# Patient Record
Sex: Female | Born: 1970 | Race: Black or African American | Hispanic: No | Marital: Single | Smoking: Current some day smoker
Health system: Southern US, Community
[De-identification: ages and names within clinical notes are randomized; demographics above are authoritative.]

## PROBLEM LIST (undated history)

## (undated) HISTORY — PX: THYROID SURGERY: SHX805

---

## 2010-02-26 ENCOUNTER — Emergency Department (HOSPITAL_COMMUNITY): Admission: EM | Admit: 2010-02-26 | Discharge: 2010-02-27 | Payer: Self-pay | Admitting: Emergency Medicine

## 2010-08-06 ENCOUNTER — Inpatient Hospital Stay (HOSPITAL_COMMUNITY): Admission: AD | Admit: 2010-08-06 | Discharge: 2010-08-06 | Payer: Self-pay | Admitting: Obstetrics & Gynecology

## 2010-08-06 ENCOUNTER — Ambulatory Visit: Payer: Self-pay | Admitting: Advanced Practice Midwife

## 2010-08-29 ENCOUNTER — Ambulatory Visit: Payer: Self-pay | Admitting: Obstetrics & Gynecology

## 2010-11-19 ENCOUNTER — Inpatient Hospital Stay (HOSPITAL_COMMUNITY): Payer: Self-pay

## 2010-11-19 ENCOUNTER — Inpatient Hospital Stay (HOSPITAL_COMMUNITY)
Admission: EM | Admit: 2010-11-19 | Discharge: 2010-11-21 | DRG: 069 | Disposition: A | Discharge status: Home / Self Care | Payer: Self-pay | Attending: Internal Medicine | Admitting: Internal Medicine

## 2010-11-19 ENCOUNTER — Emergency Department (HOSPITAL_COMMUNITY): Payer: Self-pay

## 2010-11-19 DIAGNOSIS — Z7982 Long term (current) use of aspirin: Secondary | ICD-10-CM

## 2010-11-19 DIAGNOSIS — Z23 Encounter for immunization: Secondary | ICD-10-CM

## 2010-11-19 DIAGNOSIS — F17200 Nicotine dependence, unspecified, uncomplicated: Secondary | ICD-10-CM | POA: Diagnosis present

## 2010-11-19 DIAGNOSIS — K219 Gastro-esophageal reflux disease without esophagitis: Secondary | ICD-10-CM | POA: Diagnosis present

## 2010-11-19 DIAGNOSIS — G459 Transient cerebral ischemic attack, unspecified: Principal | ICD-10-CM | POA: Diagnosis present

## 2010-11-19 DIAGNOSIS — Z91013 Allergy to seafood: Secondary | ICD-10-CM

## 2010-11-19 DIAGNOSIS — D259 Leiomyoma of uterus, unspecified: Secondary | ICD-10-CM | POA: Diagnosis present

## 2010-11-19 DIAGNOSIS — D509 Iron deficiency anemia, unspecified: Secondary | ICD-10-CM | POA: Diagnosis present

## 2010-11-19 DIAGNOSIS — E876 Hypokalemia: Secondary | ICD-10-CM | POA: Diagnosis present

## 2010-11-19 DIAGNOSIS — R0789 Other chest pain: Secondary | ICD-10-CM | POA: Diagnosis present

## 2010-11-19 LAB — BASIC METABOLIC PANEL
BUN: 8 mg/dL (ref 6–23)
CO2: 27 mEq/L (ref 19–32)
GFR calc Af Amer: >60 mL/min (ref >60)
Glucose, Bld: 114 mg/dL — ABNORMAL HIGH (ref 70–99)

## 2010-11-19 LAB — URINALYSIS, ROUTINE W REFLEX MICROSCOPIC
Hgb urine dipstick: NEGATIVE
Nitrite: NEGATIVE
Urine Glucose, Fasting: NEGATIVE mg/dL

## 2010-11-19 LAB — CBC
HCT: 25.3 % — ABNORMAL LOW (ref 36.0–46.0)
Hemoglobin: 7.4 g/dL — ABNORMAL LOW (ref 12.0–15.0)
MCV: 68.4 fL — ABNORMAL LOW (ref 78.0–100.0)
RBC: 3.7 MIL/uL — ABNORMAL LOW (ref 3.87–5.11)
RDW: 17.5 % — ABNORMAL HIGH (ref 11.5–15.5)
WBC: 6.4 10*3/uL (ref 4.0–10.5)

## 2010-11-19 LAB — LIPID PANEL
Cholesterol: 129 mg/dL (ref 0–200)
HDL: 47 mg/dL (ref >39)
LDL Cholesterol: 59 mg/dL (ref 0–99)
Total CHOL/HDL Ratio: 2.7 RATIO
Triglycerides: 115 mg/dL (ref <150)
VLDL: 23 mg/dL (ref 0–40)

## 2010-11-19 LAB — RAPID URINE DRUG SCREEN, HOSP PERFORMED
Amphetamines: NOT DETECTED
Benzodiazepines: NOT DETECTED
Cocaine: NOT DETECTED
Opiates: NOT DETECTED

## 2010-11-19 LAB — CK TOTAL AND CKMB (NOT AT ARMC)
CK, MB: 0.8 ng/mL (ref 0.3–4.0)
Relative Index: INVALID (ref 0.0–2.5)
Total CK: 77 U/L (ref 7–177)

## 2010-11-19 LAB — TSH: TSH: 0.668 u[IU]/mL (ref 0.350–4.500)

## 2010-11-19 LAB — POCT CARDIAC MARKERS: CKMB, poc: <1 ng/mL — ABNORMAL LOW (ref 1.0–8.0)

## 2010-11-19 LAB — CARDIAC PANEL(CRET KIN+CKTOT+MB+TROPI)
CK, MB: 0.7 ng/mL (ref 0.3–4.0)
Relative Index: INVALID (ref 0.0–2.5)
Troponin I: <0.01 ng/mL (ref 0.00–0.06)

## 2010-11-19 LAB — VITAMIN B12: Vitamin B-12: 650 pg/mL (ref 211–911)

## 2010-11-19 LAB — PREPARE RBC (CROSSMATCH)

## 2010-11-19 LAB — DIFFERENTIAL
Lymphs Abs: 2.8 10*3/uL (ref 0.7–4.0)
Monocytes Relative: 7 % (ref 3–12)
Neutrophils Relative %: 43 % (ref 43–77)

## 2010-11-19 LAB — IRON AND TIBC: Saturation Ratios: 3 % — ABNORMAL LOW (ref 20–55)

## 2010-11-19 LAB — APTT: aPTT: 25 seconds (ref 24–37)

## 2010-11-19 LAB — PROTIME-INR: INR: 0.9 (ref 0.00–1.49)

## 2010-11-20 ENCOUNTER — Inpatient Hospital Stay (HOSPITAL_COMMUNITY): Payer: Self-pay

## 2010-11-20 ENCOUNTER — Encounter (HOSPITAL_COMMUNITY): Payer: Self-pay | Admitting: Radiology

## 2010-11-20 LAB — BASIC METABOLIC PANEL
BUN: 7 mg/dL (ref 6–23)
Calcium: 8.8 mg/dL (ref 8.4–10.5)
GFR calc non Af Amer: >60 mL/min (ref >60)
Potassium: 3.9 mEq/L (ref 3.5–5.1)
Sodium: 139 mEq/L (ref 135–145)

## 2010-11-20 LAB — URINALYSIS, ROUTINE W REFLEX MICROSCOPIC
Ketones, ur: NEGATIVE mg/dL
Nitrite: NEGATIVE
Specific Gravity, Urine: 1.016 (ref 1.005–1.030)
Urobilinogen, UA: 0.2 mg/dL (ref 0.0–1.0)
pH: 7 (ref 5.0–8.0)

## 2010-11-20 LAB — CARDIAC PANEL(CRET KIN+CKTOT+MB+TROPI): Total CK: 34 U/L (ref 7–177)

## 2010-11-20 LAB — CBC
MCHC: 29.4 g/dL — ABNORMAL LOW (ref 30.0–36.0)
Platelets: 286 10*3/uL (ref 150–400)
RDW: 17.4 % — ABNORMAL HIGH (ref 11.5–15.5)
WBC: 6 10*3/uL (ref 4.0–10.5)

## 2010-11-20 MED ORDER — IOHEXOL 300 MG/ML  SOLN
80.0000 mL | Freq: Once | INTRAMUSCULAR | Status: AC | PRN
  Administered 2010-11-20: 80 mL via INTRAVENOUS

## 2010-11-20 MED ORDER — IOHEXOL 300 MG/ML  SOLN: 100.0000 mL | Freq: Once | INTRAMUSCULAR | Status: DC | PRN

## 2010-11-21 DIAGNOSIS — G459 Transient cerebral ischemic attack, unspecified: Secondary | ICD-10-CM

## 2010-11-21 LAB — CBC
Hemoglobin: 10 g/dL — ABNORMAL LOW (ref 12.0–15.0)
MCH: 21.6 pg — ABNORMAL LOW (ref 26.0–34.0)
MCHC: 30 g/dL (ref 30.0–36.0)
MCV: 71.9 fL — ABNORMAL LOW (ref 78.0–100.0)
RBC: 4.63 MIL/uL (ref 3.87–5.11)

## 2010-11-21 LAB — URINE CULTURE
Colony Count: NO GROWTH
Culture: NO GROWTH

## 2010-11-22 LAB — TYPE AND SCREEN
ABO/RH(D): AB POS
Antibody Screen: NEGATIVE
Unit division: 0
Unit division: 0

## 2010-11-26 ENCOUNTER — Inpatient Hospital Stay (HOSPITAL_COMMUNITY): Payer: Self-pay

## 2010-11-26 ENCOUNTER — Inpatient Hospital Stay (HOSPITAL_COMMUNITY)
Admission: AD | Admit: 2010-11-26 | Discharge: 2010-11-26 | Disposition: A | Discharge status: Home / Self Care | Payer: Self-pay | Source: Ambulatory Visit | Attending: Obstetrics & Gynecology | Admitting: Obstetrics & Gynecology

## 2010-11-26 DIAGNOSIS — D259 Leiomyoma of uterus, unspecified: Secondary | ICD-10-CM | POA: Insufficient documentation

## 2010-11-26 DIAGNOSIS — R109 Unspecified abdominal pain: Secondary | ICD-10-CM

## 2010-11-26 LAB — URINALYSIS, ROUTINE W REFLEX MICROSCOPIC
Nitrite: NEGATIVE
Specific Gravity, Urine: 1.01 (ref 1.005–1.030)
Urobilinogen, UA: 0.2 mg/dL (ref 0.0–1.0)
pH: 7 (ref 5.0–8.0)

## 2010-11-26 LAB — CBC
Hemoglobin: 11.1 g/dL — ABNORMAL LOW (ref 12.0–15.0)
MCH: 22.5 pg — ABNORMAL LOW (ref 26.0–34.0)
Platelets: 261 10*3/uL (ref 150–400)
RBC: 4.94 MIL/uL (ref 3.87–5.11)
WBC: 6.5 10*3/uL (ref 4.0–10.5)

## 2010-11-26 LAB — WET PREP, GENITAL
Clue Cells Wet Prep HPF POC: NONE SEEN
Trich, Wet Prep: NONE SEEN

## 2010-11-26 LAB — POCT PREGNANCY, URINE: Preg Test, Ur: NEGATIVE

## 2010-11-29 NOTE — H&P (Signed)
[name], [name]               ACCOUNT NO.:  [id number]  MEDICAL RECORD NO.:  [id number]           PATIENT TYPE:  E  LOCATION:  [facility]                         FACILITY:  [facility]  PHYSICIAN:  Lonia Blood, M.D.      DATE OF BIRTH:  02-Jun-1971  DATE OF ADMISSION:  11/19/2010 DATE OF DISCHARGE:                             HISTORY & PHYSICAL   PRIMARY CARE PHYSICIAN:  She is unassigned to [place].  PRESENTING COMPLAINT:  Numbness and tingling in the left leg and possibly passing out.  HISTORY OF PRESENT ILLNESS:  The patient is a 40 year old female with history of anemia and chronic tobacco abuse, who apparently has been doing well until last evening when she suddenly had initially generalized tingling, abnormal sensation, and then left-sided tingling and numbness.  The patient was unable to move even though she was conscious.  She did report initially that she passed out, but later said that she was not really blacked out, she knew what was going on, but could not move her body at all.  This lasted few seconds.  It again came back after a couple of hours.  She feels generalized weakness of the moment, but the symptoms are literally gone.  She denied any diarrhea. No recent nausea or vomiting.  No fever.  No chills.  She has noted some mild exertional dyspnea in the past, but no PND.  No orthopnea.  No prior cardiac history and no history of seizure disorder.  The patient has slight chest pain during the episodes.  PAST MEDICAL HISTORY:  Significant for goiter and anemia.  PAST SURGICAL HISTORY:  Status post removal of goiter from her thyroid gland.  ALLERGIES:  SHELLFISH.  MEDICATIONS:  Currently none.  SOCIAL HISTORY:  The patient lives in [place].  She denied any alcohol or IV drug use.  She smokes about 2 cigarettes a day.  FAMILY HISTORY:  Both parents have diabetes and hypertension.  Her mother had stroke as well as MI in her 72s.  Her stroke was at the age of  40.  REVIEW OF SYSTEMS:  All systems reviewed are otherwise negative except per HPI.  PHYSICAL EXAMINATION:  VITAL SIGNS:  Temperature is 98.6, blood pressure 130/107 initially, currently 111/78 with a pulse of 91, respiratory rate 21, sats 99% on room air. GENERAL:  She is awake, alert, and oriented.  Seems worried, but she is in no acute distress.  HEENT:  PERRL.  EOMI.  No pallor.  No jaundice. No rhinorrhea. NECK:  Supple.  No JVD.  No lymphadenopathy. RESPIRATORY:  She has good air entry bilaterally.  No wheezes.  No rales.  No crackles.  CARDIOVASCULAR SYSTEM:  She has S1 and S2.  No audible murmur. ABDOMEN:  Soft, full, nontender with positive bowel sounds. EXTREMITIES:  No edema, cyanosis, or clubbing. SKIN:  No rashes or ulcers.  LABORATORY DATA:  Her labs showed urinalysis negative.  White count 6.4, hemoglobin 7.4 with an MCV of 68.4, platelet count 287, normal differentials.  Sodium is 138, potassium 3.3, chloride 104, CO2 of 27, glucose 114, BUN 8, creatinine 0.77, and calcium 8.8.  Chest x-ray showed no acute disease.  Head CT without contrast also no significant findings.  Her EKG showed normal sinus rhythm with a rate of 78.  Normal intervals with nonspecific T-wave changes.  ASSESSMENT:  This is a 40 year old female presenting with what appears to be transient ischemic attack versus syncopal episode versus convulsion disorder.  The patient has risk factors for cerebrovascular accident including tobacco abuse, family history, and probably elevated blood pressure as indicated by her initial blood pressure.  She also does not know her cholesterol status.  She also has some family history of early coronary artery disease and this could reflect some cardiac causes, but could also be some elements of anxiety including panic attacks and convulsion disorder, although she had no prior history of psychiatric illness.  Plan therefore; 1. Left-sided numbness, tingling.  At  this point, we will admit the     patient and workup for possible TIA.  It could have been a seizure     disorder too during that period.  We will check carotid Dopplers, 2-     D echo, MRI, MRA of the brain as well as possibly an EEG.  We will     also consider anemia as a cause of syncopal episode, although there     is no active bleed.  I will work her anemia up including checking     stool guaiac, following her hemoglobin closely. 2. Anemia, seems chronic.  The patient reported occasional heavy     periods and she has been told in the past that she may have uterine     fibroid, so this may explain the source of her bleeds.  She     finished her menstrual period about a week ago.  We will check     anemia panel, type and cross match for 2 units of packed red blood     and consider transfusing especially if hemoglobin drops to 7 or     below 7. 3. Tobacco abuse.  Given her some nicotine patch and tobacco cessation     counseling.  Otherwise, further treatment will depend on initial     findings and the patient's response to initial measures.     Lonia Blood, M.D.     Verlin Grills  D:  11/19/2010  T:  11/19/2010  Job:  [id number]  Electronically Signed by Lonia Blood M.D. on 11/29/2010 06:32:38 AM

## 2010-12-04 LAB — CBC
MCV: 70.5 fL — ABNORMAL LOW (ref 78.0–100.0)
Platelets: 208 10*3/uL (ref 150–400)
RBC: 3.93 MIL/uL (ref 3.87–5.11)
RDW: 18.6 % — ABNORMAL HIGH (ref 11.5–15.5)
WBC: 6.5 10*3/uL (ref 4.0–10.5)

## 2010-12-04 LAB — URINALYSIS, ROUTINE W REFLEX MICROSCOPIC
Bilirubin Urine: NEGATIVE
Glucose, UA: NEGATIVE mg/dL
Protein, ur: NEGATIVE mg/dL
Urobilinogen, UA: 0.2 mg/dL (ref 0.0–1.0)

## 2010-12-04 LAB — URINE MICROSCOPIC-ADD ON

## 2010-12-05 NOTE — Discharge Summary (Signed)
[name], [name]               ACCOUNT NO.:  [id number]  MEDICAL RECORD NO.:  [id number]           PATIENT TYPE:  I  LOCATION:  3733                         FACILITY:  [facility]  PHYSICIAN:  Thad Ranger, MD       DATE OF BIRTH:  [birth year]  DATE OF ADMISSION:  11/19/2010 DATE OF DISCHARGE:  11/21/2010                              DISCHARGE SUMMARY   PRIMARY CARE PHYSICIAN:  The patient follows OB at [facility].  DISCHARGE DIAGNOSES: 1. Questionable transient ischemic attack symptoms, resolved. 2. Severe anemia secondary to iron deficiency and uterine fibroids. 3. Atypical chest pain, resolved. 4. Abdominal pain and back pain secondary to uterine fibroids,     improved. 5. Tobacco abuse.  DISCHARGE MEDICATIONS: 1. Ferrous gluconate 325 mg p.o. daily. 2. Percocet 5/325 mg every 6 hours as needed for pain. 3. Protonix 40 mg p.o. daily. 4. Aspirin 81 mg p.o. daily.  BRIEF HISTORY OF PRESENT ILLNESS:  At the time of admission:  [name] is a 40 year old female with history of anemia, chronic tobacco abuse. Apparently was doing well until the evening before the admission when she suddenly had a generalized tingling, abnormal sensation, and left- sided tingling and numbness.  The patient was unable to move even though she was conscious.  She did report initially that she passed out, but then later said she was not really blacked out, but could not move her body at all.  This lasted a few seconds.  For details, please refer to the admission note dictated by Dr. Lonia Blood on November 19, 2010.  RADIOLOGICAL DATA AND PROCEDURES:  Chest x-ray portable on February 27, no acute cardiopulmonary disease.  CT head without contrast, unremarkable.  CT abdomen and pelvis, enlarged and heterogenous uterus, likely due to fibroids; small liver lesion as described, 6 mm in the lateral segment of left lobe of the liver, anteriorly diffuse hepatic steatosis.  MRI of the brain  showed no acute abnormalities, solitary small hyperintensity in the left frontal white matter, not likely clinically insignificant, could cause chronic ischemia or migraine headaches, demyelinating disease is not felt likely.  MRI of the head was normal.  EEG interpretation on February 28, normal.  Two-D echocardiogram on February 29 showed EF of 50% with mild global hypokinesis, grade 1 diastolic dysfunction.  BRIEF HOSPITALIZATION COURSE:  [name] is a 40 year old female who initially presented with left-sided numbness, tingling which was felt possible TIA. 1. Possible TIA versus symptoms from severe anemia.  The patient was     admitted for the TIA workup.  She underwent MRI and MRA of the     head, which was essentially negative for any acute stroke.  The     patient was placed on aspirin.  During the hospitalization, she did     complain of atypical chest pain.  Two-D echocardiogram revealed EF     of 50% with mild hypokinesis.  No regional wall motion     abnormalities. 2. Chronic anemia.  The patient has a history of uterine fibroids and     has occasional heavy menstrual cycle.  CT abdomen  and pelvis done     showed enlarged and heterogenous uterus, likely due to fibroids.     The patient has an appointment with her OB next month and wants to     undergo hysterectomy at that point.  We will defer that to her     OB[facility]GYN at [facility]. 3. GERD.  The patient did mention acid reflux-type symptoms and has     been taking multiple NSAIDs due to abdominal pain from the uterine     fibroids.  The patient was counseled strongly to avoid NSAIDs as it     was exacerbating her GERD.  The patient was given Percocet p.r.n.     for the pain relief.  DISCHARGE PHYSICAL EXAM:  VITAL SIGNS:  At the time of discharge, temperature 98.8, pulse 84, respirations 18, blood pressure 132/85, O2 sats 99% on room air. GENERAL:  The patient is alert, awake, and oriented x3, not in  acute distress. HEENT:  Anicteric sclerae and conjunctivae.  Pupils reactive to light and accommodation.  EOMI. NECK:  Supple.  No lymphadenopathy, no JVD. CVS:  S1 and S2 clear. CHEST:  Clear to auscultation bilaterally. ABDOMEN:  Soft, nontender.  Normal bowel sounds. EXTREMITIES:  No cyanosis, clubbing, or edema noted in upper or lower extremities bilaterally. NEUROLOGIC:  No focal neurological deficits noted.  LAB AND DIAGNOSTIC DATA:  At the time of discharge, white count 9.6, hemoglobin 10.0, hematocrit 33.3, platelets 301.  DISCHARGE FOLLOWUP:  With her scheduled OB next month.  DISCHARGE DIET:  Regular.  DISCHARGE TIME:  Thirty-five minutes.     Thad Ranger, MD     RR/MEDQ  D:  11/21/2010  T:  11/22/2010  Job:  [id number]  Electronically Signed by RIPUDEEP RAI  on 12/05/2010 07:45:31 AM

## 2010-12-10 LAB — URINE MICROSCOPIC-ADD ON

## 2010-12-10 LAB — WET PREP, GENITAL
Clue Cells Wet Prep HPF POC: NONE SEEN
WBC, Wet Prep HPF POC: NONE SEEN
Yeast Wet Prep HPF POC: NONE SEEN

## 2010-12-10 LAB — BASIC METABOLIC PANEL
BUN: 7 mg/dL (ref 6–23)
CO2: 27 mEq/L (ref 19–32)
Calcium: 9 mg/dL (ref 8.4–10.5)
Chloride: 107 mEq/L (ref 96–112)
Creatinine, Ser: 0.72 mg/dL (ref 0.4–1.2)
GFR calc Af Amer: >60 mL/min (ref >60)
Glucose, Bld: 93 mg/dL (ref 70–99)

## 2010-12-10 LAB — URINALYSIS, ROUTINE W REFLEX MICROSCOPIC
Bilirubin Urine: NEGATIVE
Leukocytes, UA: NEGATIVE
Nitrite: NEGATIVE
Specific Gravity, Urine: 1.033 — ABNORMAL HIGH (ref 1.005–1.030)
Urobilinogen, UA: 1 mg/dL (ref 0.0–1.0)
pH: 5.5 (ref 5.0–8.0)

## 2010-12-10 LAB — DIFFERENTIAL
Eosinophils Relative: 4 % (ref 0–5)
Monocytes Relative: 6 % (ref 3–12)
Neutro Abs: 3.5 10*3/uL (ref 1.7–7.7)
Neutrophils Relative %: 54 % (ref 43–77)

## 2010-12-10 LAB — CBC
MCHC: 33.4 g/dL (ref 30.0–36.0)
MCV: 89.3 fL (ref 78.0–100.0)
RBC: 3.76 MIL/uL — ABNORMAL LOW (ref 3.87–5.11)
RDW: 16.1 % — ABNORMAL HIGH (ref 11.5–15.5)

## 2010-12-10 LAB — GC/CHLAMYDIA PROBE AMP, GENITAL: GC Probe Amp, Genital: NEGATIVE

## 2010-12-19 ENCOUNTER — Encounter: Payer: Self-pay | Admitting: Obstetrics & Gynecology

## 2010-12-26 ENCOUNTER — Other Ambulatory Visit: Payer: Self-pay | Admitting: Obstetrics and Gynecology

## 2010-12-26 ENCOUNTER — Encounter (INDEPENDENT_AMBULATORY_CARE_PROVIDER_SITE_OTHER): Payer: Self-pay | Admitting: Obstetrics and Gynecology

## 2010-12-26 DIAGNOSIS — D259 Leiomyoma of uterus, unspecified: Secondary | ICD-10-CM

## 2010-12-27 NOTE — Progress Notes (Signed)
NAME:  [name], [name]               ACCOUNT NO.:  [id number]  MEDICAL RECORD NO.:  [id number]           PATIENT TYPE:  A  LOCATION:  [facility]                   FACILITY:  [facility]  PHYSICIAN:  Catalina Antigua, MD     DATE OF BIRTH:  1971/07/15  DATE OF SERVICE:                                 CLINIC NOTE  This is a 40 year old G2, P2 who presents today as an MAU followup for evaluation of fibroid uterus and pelvic pain.  The patient reports a 2- year history of heavy vaginal bleeding which required a blood transfusion x2, separate episode, separate occasions and severe anemia as a result of her menorrhagia.  The patient was initially managed with oral contraceptive pills which she states decreased her flow, but did not really take away her pain.  The patient then received Depot Lupron, which she states it helped significantly, but unfortunately that regimen could not have been continued for more than 3 doses as she stated.  The patient now presents requesting further intervention.  The patient states that her periods occur monthly, but they are very heavy and last on average 8 days accompanied by severe pelvic pain.  The patient is currently being treated with tramadol for pelvic pain which helped significantly.  PAST MEDICAL HISTORY:  Significant for asthma.  PAST SURGICAL HISTORY:  She has had a removal of goiter, but is currently euthyroid.  She also had a bilateral tubal ligation.  PAST GYN HISTORY:  She has had a fibroid uterus.  Denies any history of ovarian cyst.  She does have a history of abnormal Pap smear in 1998, which was treated with cryotherapy, but the patient failed to follow up and has not seen a GYN since.  FAMILY HISTORY:  Significant for diabetes, hypertension, coronary artery disease, ovarian cancer in her grandmother in her 21s and her grandmother also had a blood clot in her lower extremity.  SOCIAL HISTORY:  She is a current smoker of 2-3 cigarettes per day  for past 3 years.  Occasional drinker and denies the use of illicit drugs.  REVIEW OF SYSTEMS:  Otherwise significant for occasional joint pain, occasional generalized weakness, and dyspareunia.  PHYSICAL EXAMINATION:  VITAL SIGNS:  Her blood pressure is 115/83, pulse of 97, weight of 88 kg, height is 66 inches. LUNGS:  Clear to auscultation bilaterally. HEART:  Regular rate and rhythm. ABDOMEN:  Soft, nontender and nondistended. PELVIC:  Showed normal-appearing vaginal mucosa and normal-appearing cervix.  No abnormal bleeding or discharge.  She had approximately 10- week size uterus with palpable fibroids on the posterior aspect, otherwise no palpable adnexal masses or tenderness.  The patient had an ultrasound performed on November 26, 2010, which demonstrated a uterus measuring 11 x 7 x 9 cm with at least 3 separate fibroids.  The largest is located in the right posterior uterine body and has a greater than 50% submucosal component and measures 5.1 cm and the other 2 fibroids are located in the right lateral uterine wall measuring 4 cm and in the left frontal region measuring 3.4 cm.  She had normal-appearing left and right ovary. Endometrial thickness was 12 mm  and the endometrium was displaced anteriorly secondary to submucosal fibroid.  ASSESSMENT AND PLAN:  This is a 40 year old G2, P2 with a 2-year history of menorrhagia and pelvic pain secondary to fibroid uterus who presents today requesting intervention.  A Pap smear was performed today. Medical managements with Depot Lupron was discussed with the patient. The patient is also interested in surgical intervention.  Hysterectomy was discussed with the patient, and the patient desires as definitive treatment.  The patient states that her sister had a laparoscopic hysterectomy performed, for which she is interested in having that performed for her as well. Plan is for the patient to receive Depot Lupron for now and she will  return to be seen by Dr. Penne Lash, Dr. Shawnie Pons, or Dr. Marice Potter for surgical consultation for possible laparoscopic hysterectomy.  The risks and benefits of the procedures were explained, the patient verbalized understanding.  All questions were answered.          ______________________________ Catalina Antigua, MD    PC/MEDQ  D:  12/26/2010  T:  12/27/2010  Job:  [id number]

## 2011-01-30 ENCOUNTER — Ambulatory Visit: Payer: Self-pay | Admitting: Obstetrics & Gynecology

## 2011-02-04 ENCOUNTER — Ambulatory Visit: Payer: Self-pay

## 2011-02-05 ENCOUNTER — Ambulatory Visit: Payer: Self-pay

## 2011-02-05 DIAGNOSIS — D259 Leiomyoma of uterus, unspecified: Secondary | ICD-10-CM

## 2011-04-30 ENCOUNTER — Other Ambulatory Visit: Payer: Self-pay | Admitting: Obstetrics and Gynecology

## 2011-04-30 ENCOUNTER — Ambulatory Visit: Payer: Self-pay

## 2011-05-01 ENCOUNTER — Ambulatory Visit (INDEPENDENT_AMBULATORY_CARE_PROVIDER_SITE_OTHER): Payer: Self-pay | Admitting: *Deleted

## 2011-05-01 VITALS — BP 114/84 | HR 87 | Temp 97.3°F | Ht 66.0 in

## 2011-05-01 DIAGNOSIS — D219 Benign neoplasm of connective and other soft tissue, unspecified: Secondary | ICD-10-CM

## 2011-05-01 DIAGNOSIS — D259 Leiomyoma of uterus, unspecified: Secondary | ICD-10-CM

## 2011-05-01 MED ORDER — TRAMADOL HCL 50 MG PO TABS: 50.0000 mg | ORAL_TABLET | ORAL | Status: DC

## 2011-05-01 MED ORDER — LEUPROLIDE ACETATE (3 MONTH) 11.25 MG IM KIT
11.2500 mg | PACK | Freq: Once | INTRAMUSCULAR | Status: AC
  Administered 2011-05-01: 11.25 mg via INTRAMUSCULAR

## 2011-05-01 NOTE — Progress Notes (Signed)
Patient last seen in April and expressed the desire at that time for surgical intervention via laparoscopy. Patient never followed up with appropriate surgeons for surgical planning as instructed. Patient states she was waiting to see the effects of the depo-Lupron. Patient was reminded that the depo-Lupron was being used as an aid to facilitate her laparoscopic surgery. Patient was asked to schedule an appointment before leaving today. A refill on Tramadol was provided. Patient was informed that no further pain medications will be provided without being seen by a physician.

## 2011-05-01 NOTE — Progress Notes (Signed)
Patient states would like refill on Tramadol and Vicodin 5/500.

## 2011-05-01 NOTE — Progress Notes (Signed)
Addended by: Lynnell Dike on: 05/01/2011 03:27 PM   Modules accepted: Orders, Level of Service

## 2011-11-02 IMAGING — US US TRANSVAGINAL NON-OB
1 series · 13 of 25 positions shown · non-contrast
Comparison: None.

CLINICAL DATA: Lower abdominal and pelvic pain.  Negative urine
pregnancy test.  LMP 11/11/2010.



[Series 1: us pelvis complete · 13 of 91 slices shown]
[im 1/91]
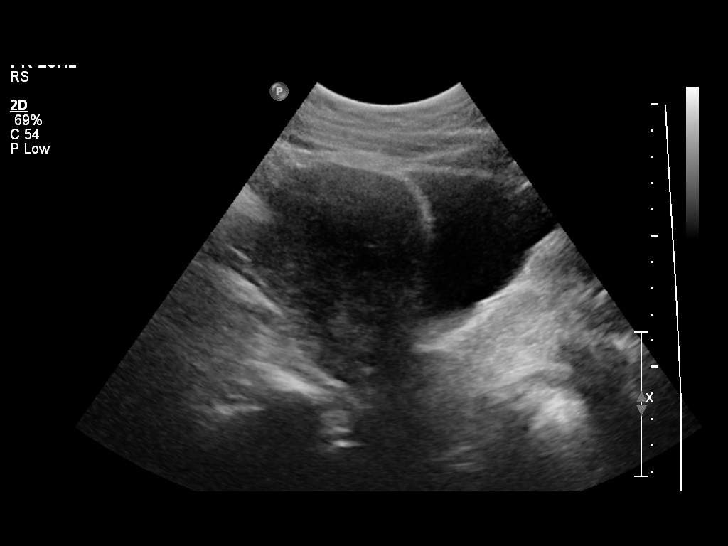
[im 8/91]
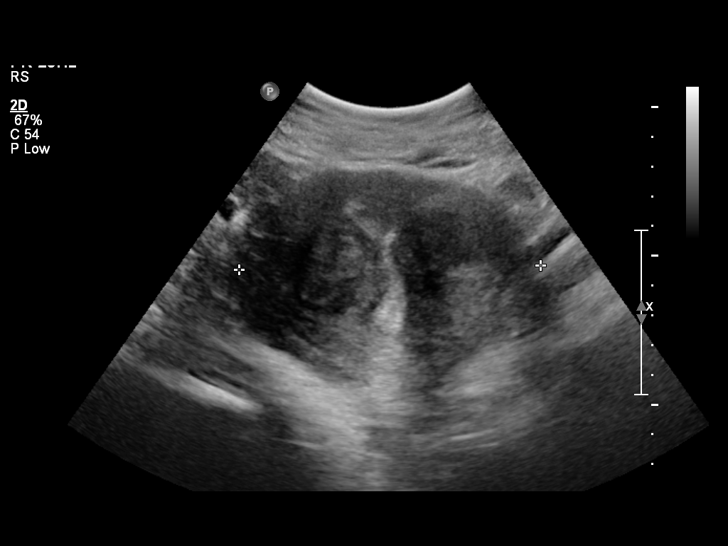
[im 16/91]
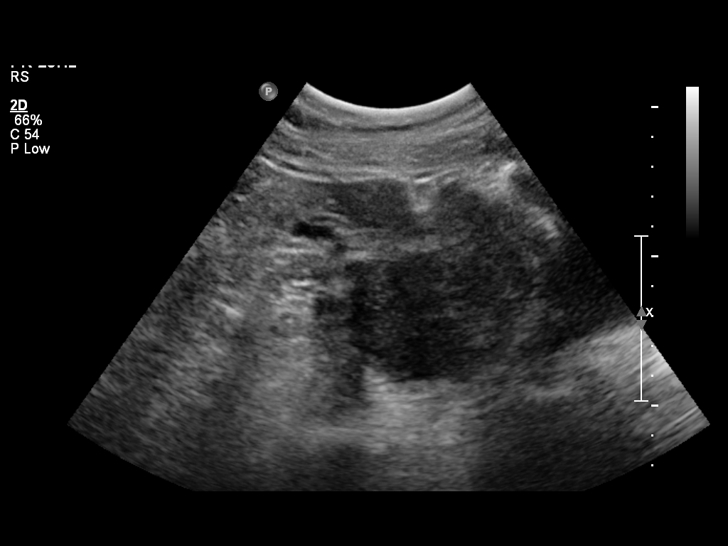
[im 23/91]
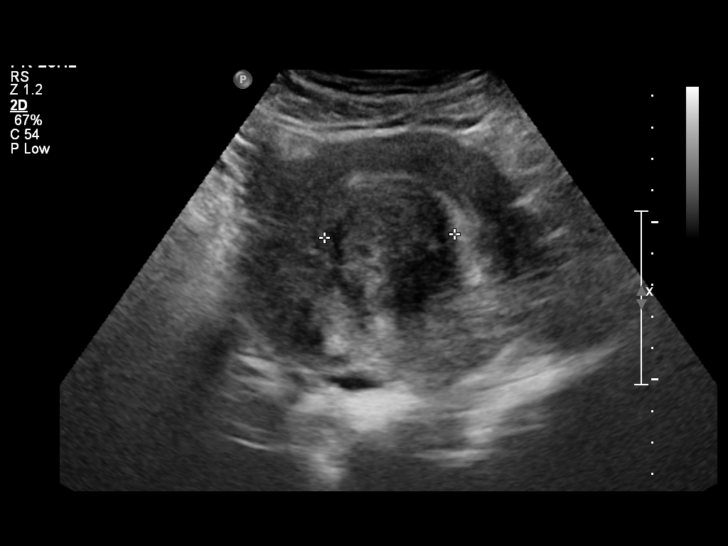
[im 31/91]
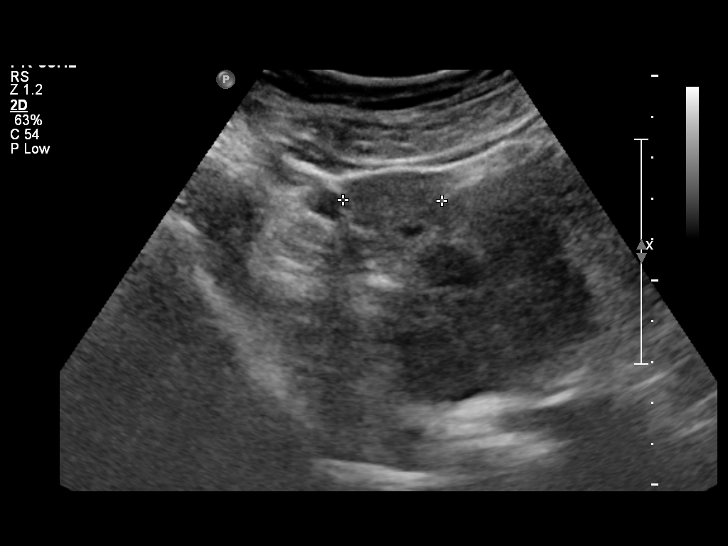
[im 38/91]
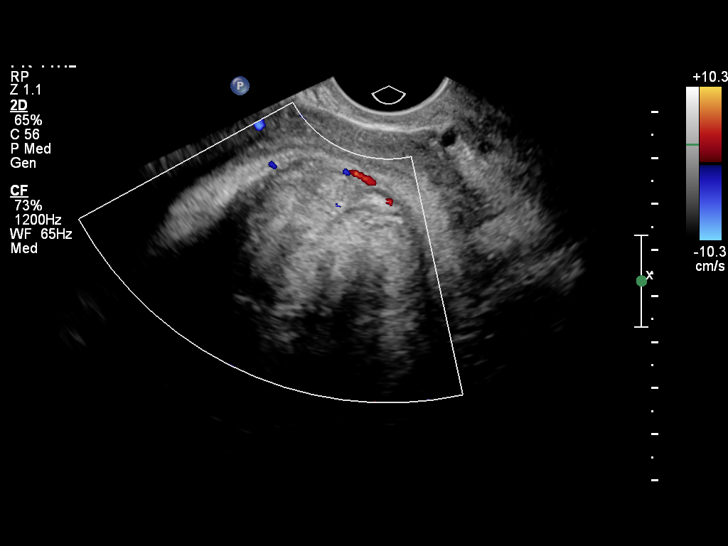
[im 46/91]
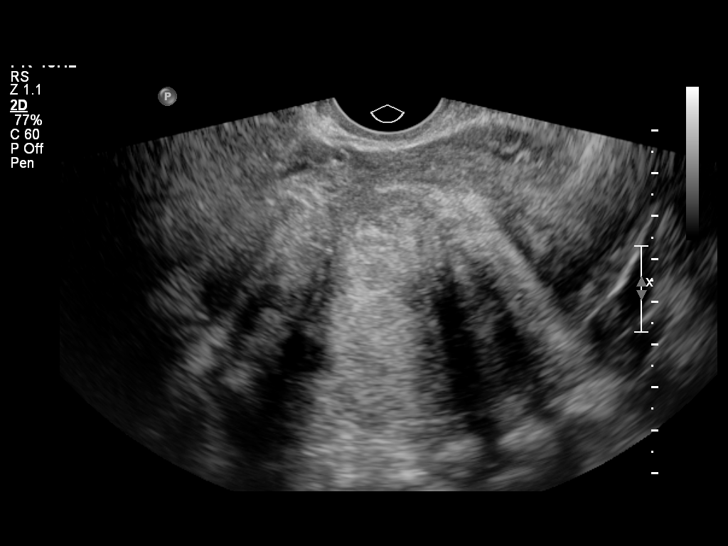
[im 53/91]
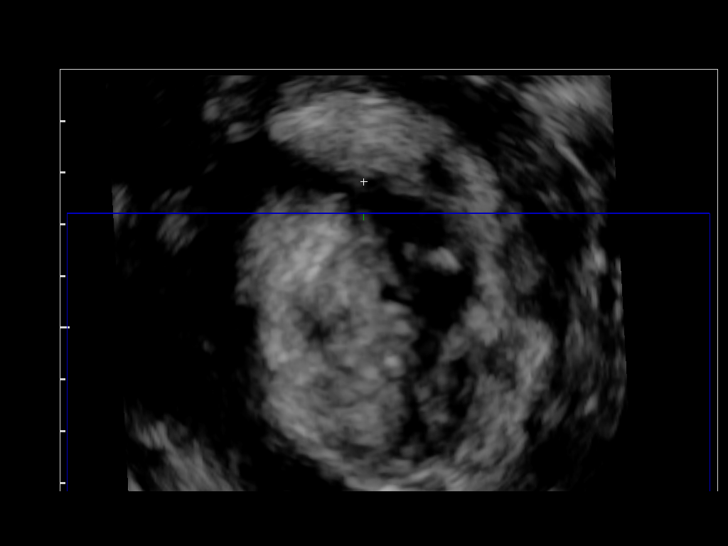
[im 61/91]
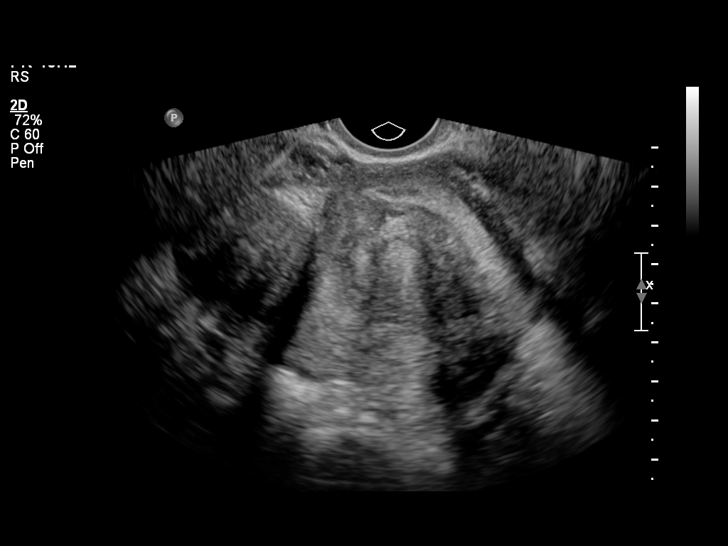
[im 68/91]
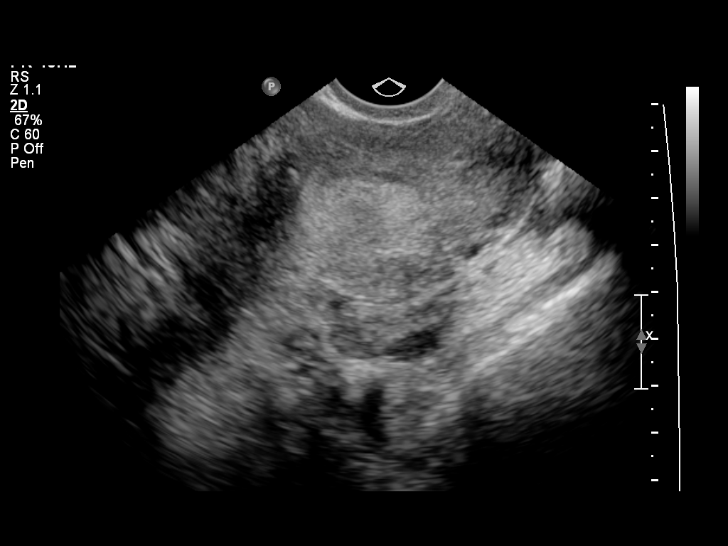
[im 76/91]
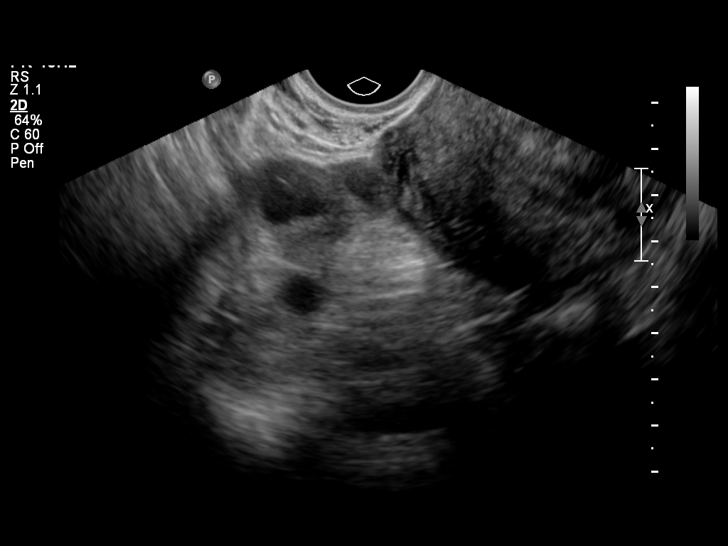
[im 83/91]
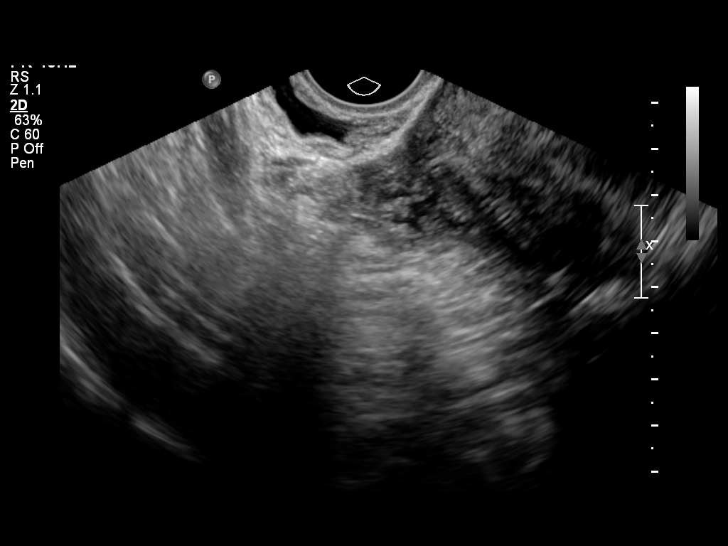
[im 91/91]
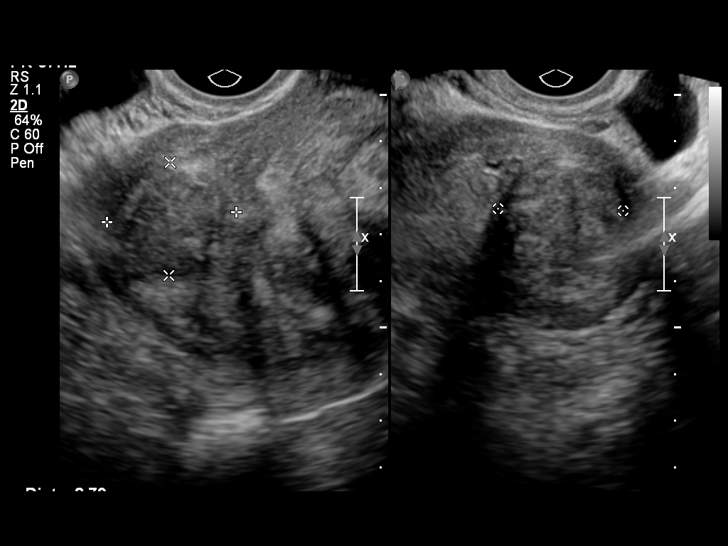

[13 of 25 positions shown; findings below may reference images not displayed]

FINDINGS: Uterus measures 11.2 x 7.0 x 8.7 cm. At least three separate
fibroids are seen.  The largest is located in the right posterior
uterine body, and has a significant (greater than 50%) submucosal
component protruding into the endometrial cavity.  This measures
5.1 cm in maximal diameter.

The other two fibroids are located in the right lateral uterine
wall measuring 4.3 cm, and in the left fundal region measuring
cm in maximum diameter.

Endometrium measures 12 mm in thickness.  Endometrium is displaced
anteriorly and to the left by the submucosal fibroid described
above.

Right Ovary measures 4.1 x 2.5 x 2.4 cm. Normal appearance.

Left Ovary measures 2.8 x 1.3 x 1.9 cm.  Normal appearance.

Other Findings:  No other abnormality identified.
IMPRESSION: 1.  Mildly enlarged uterus with at least three fibroids.  The
largest of these measures 5.1 cm, and has a significant (greater
than 50%) submucosal component protruding into the endometrial
cavity.
2.  Normal ovaries.  No evidence of adnexal mass.

## 2012-01-15 ENCOUNTER — Ambulatory Visit (HOSPITAL_COMMUNITY): Payer: Self-pay | Admitting: Physical Therapy

## 2012-01-16 ENCOUNTER — Ambulatory Visit (HOSPITAL_COMMUNITY): Payer: Self-pay | Admitting: Physical Therapy

## 2012-01-29 ENCOUNTER — Encounter: Payer: Self-pay | Admitting: Obstetrics and Gynecology

## 2014-08-03 ENCOUNTER — Telehealth: Payer: Self-pay | Admitting: *Deleted

## 2014-08-03 ENCOUNTER — Ambulatory Visit: Payer: Self-pay | Admitting: Obstetrics & Gynecology

## 2014-08-03 ENCOUNTER — Encounter: Payer: Self-pay | Admitting: *Deleted

## 2014-08-03 NOTE — Telephone Encounter (Signed)
Attempted to contact patient to inform of missed appointment, no answer, invalid number.  Will send letter.  Letter sent.

## 2014-10-23 ENCOUNTER — Inpatient Hospital Stay (HOSPITAL_COMMUNITY)
Admission: AD | Admit: 2014-10-23 | Discharge: 2014-10-23 | Disposition: A | Discharge status: Home / Self Care | Payer: Self-pay | Source: Ambulatory Visit | Attending: Obstetrics & Gynecology | Admitting: Obstetrics & Gynecology

## 2014-10-23 ENCOUNTER — Inpatient Hospital Stay (HOSPITAL_COMMUNITY): Payer: Self-pay

## 2014-10-23 ENCOUNTER — Encounter (HOSPITAL_COMMUNITY): Payer: Self-pay | Admitting: *Deleted

## 2014-10-23 DIAGNOSIS — A599 Trichomoniasis, unspecified: Secondary | ICD-10-CM

## 2014-10-23 DIAGNOSIS — N939 Abnormal uterine and vaginal bleeding, unspecified: Secondary | ICD-10-CM | POA: Insufficient documentation

## 2014-10-23 DIAGNOSIS — F17210 Nicotine dependence, cigarettes, uncomplicated: Secondary | ICD-10-CM | POA: Insufficient documentation

## 2014-10-23 DIAGNOSIS — D259 Leiomyoma of uterus, unspecified: Secondary | ICD-10-CM | POA: Insufficient documentation

## 2014-10-23 DIAGNOSIS — A5901 Trichomonal vulvovaginitis: Secondary | ICD-10-CM | POA: Insufficient documentation

## 2014-10-23 LAB — CBC
HCT: 30.5 % — ABNORMAL LOW (ref 36.0–46.0)
Hemoglobin: 9 g/dL — ABNORMAL LOW (ref 12.0–15.0)
MCH: 21.2 pg — ABNORMAL LOW (ref 26.0–34.0)
MCHC: 29.5 g/dL — AB (ref 30.0–36.0)
MCV: 71.9 fL — ABNORMAL LOW (ref 78.0–100.0)
PLATELETS: 362 10*3/uL (ref 150–400)
RBC: 4.24 MIL/uL (ref 3.87–5.11)
RDW: 21.5 % — AB (ref 11.5–15.5)
WBC: 7.5 10*3/uL (ref 4.0–10.5)

## 2014-10-23 LAB — URINE MICROSCOPIC-ADD ON

## 2014-10-23 LAB — WET PREP, GENITAL
CLUE CELLS WET PREP: NONE SEEN
Yeast Wet Prep HPF POC: NONE SEEN

## 2014-10-23 LAB — URINALYSIS, ROUTINE W REFLEX MICROSCOPIC
Bilirubin Urine: NEGATIVE
GLUCOSE, UA: NEGATIVE mg/dL
KETONES UR: NEGATIVE mg/dL
LEUKOCYTES UA: NEGATIVE
Nitrite: NEGATIVE
PROTEIN: 100 mg/dL — AB
Specific Gravity, Urine: >1.03 — ABNORMAL HIGH (ref 1.005–1.030)
Urobilinogen, UA: 0.2 mg/dL (ref 0.0–1.0)
pH: 6 (ref 5.0–8.0)

## 2014-10-23 LAB — POCT PREGNANCY, URINE: Preg Test, Ur: NEGATIVE

## 2014-10-23 MED ORDER — METRONIDAZOLE 500 MG PO TABS: 2000.0000 mg | ORAL_TABLET | Freq: Once | ORAL | Status: AC

## 2014-10-23 MED ORDER — MEGESTROL ACETATE 40 MG PO TABS
40.0000 mg | ORAL_TABLET | Freq: Every day | ORAL | Status: DC
  Administered 2014-10-23: 40 mg via ORAL
  Filled 2014-10-23 (×2): qty 1

## 2014-10-23 MED ORDER — KETOROLAC TROMETHAMINE 60 MG/2ML IM SOLN
60.0000 mg | Freq: Once | INTRAMUSCULAR | Status: AC
Start: 2014-10-23 — End: 2014-10-23
  Administered 2014-10-23: 60 mg via INTRAMUSCULAR
  Filled 2014-10-23: qty 2

## 2014-10-23 MED ORDER — ONDANSETRON 8 MG PO TBDP
8.0000 mg | ORAL_TABLET | Freq: Once | ORAL | Status: AC
  Administered 2014-10-23: 8 mg via ORAL
  Filled 2014-10-23: qty 1

## 2014-10-23 MED ORDER — MEGESTROL ACETATE 40 MG PO TABS: 40.0000 mg | ORAL_TABLET | Freq: Four times a day (QID) | ORAL | Status: AC

## 2014-10-23 MED ORDER — TRAMADOL HCL 50 MG PO TABS: 50.0000 mg | ORAL_TABLET | Freq: Four times a day (QID) | ORAL | Status: AC | PRN

## 2014-10-23 NOTE — Discharge Instructions (Signed)
Has an appointment in the clinic on 10-26-14 for further evaluation. No blood transfusion needed today. Get your medication filled at the pharmacy and take both prescriptions as directed. Advised to have partner seek treatment for trichomonas. No sex for 10 days.  No sex until 10 days after partner is treated.

## 2014-10-23 NOTE — MAU Provider Note (Signed)
History     CSN: [id number]  Arrival date and time: 10/23/14 0846  Seen by provider at [place]  Patient presents with  . Vaginal Bleeding  . Chest Pain  . Shortness of Breath  . Left side pain    HPI [name] 44 y.o. Comes to MAU with heavy vaginal bleeding that started yesterday.  Has pain in her left side to the lower abdominal midline.  Has some nausea as the pain is severe.  History of being hospitalized in [place], [place] and receiving a blood transfusion as her blood count was low - discharged 09-22-14.  Has an appointment in the clinic for further evaluation.  Client reports she does have periodic sharp chest pain but none at this time.  There is no distress and no diaphoresis.  Denies heartburn.  OB History    Gravida Para Term Preterm AB TAB SAB Ectopic Multiple Living   2 2 2       2       History reviewed. No pertinent past medical history.  Past Surgical History  Procedure Laterality Date  . Thyroid surgery      History reviewed. No pertinent family history.  History  Substance Use Topics  . Smoking status: Current Some Day Smoker  . Smokeless tobacco: Never Used  . Alcohol Use: Yes     Comment: Occasional     Allergies: No Known Allergies  Prescriptions prior to admission  Medication Sig Dispense Refill Last Dose  . traMADol (ULTRAM) 50 MG tablet Take 1 tablet (50 mg total) by mouth every 4 (four) hours. Take one every 4 to 6 hours PRN (Patient not taking: Reported on 10/23/2014) 30 tablet 0     Review of Systems  Constitutional: Negative for fever.  Cardiovascular: Positive for chest pain.  Gastrointestinal: Positive for nausea and abdominal pain. Negative for vomiting, diarrhea and constipation.  Genitourinary:       No vaginal discharge. Vaginal bleeding. No dysuria.   Physical Exam   Blood pressure 110/67, pulse 68, temperature 98.4 F (36.9 C), resp. rate 16, height 5\' 4"  (1.626 m), weight 194 lb (87.998 kg), last  menstrual period 10/22/2014, SpO2 100 %.  Physical Exam  Nursing note and vitals reviewed. Constitutional: She is oriented to person, place, and time. She appears well-developed and well-nourished.  HENT:  Head: Normocephalic.  Eyes: EOM are normal.  Neck: Neck supple.  Cardiovascular: Normal rate and regular rhythm.   Denies chest pain at present.  Respiratory: Effort normal and breath sounds normal.  O2 sat - 100%  GI: Soft. There is tenderness. There is no rebound and no guarding.  Has pain in left side above hip bone which radiates to low midline.  No masses palpated in abdomen.  Genitourinary:  Speculum exam:  Large pad removed which is completely saturated Vagina - Mod amount of dark vaginal blood - no clots Cervix - No contact bleeding Bimanual exam: Cervix closed Uterus mildly tender and firm, normal size Adnexa non tender, no masses bilaterally GC/Chlam, wet prep done Chaperone present for exam.  Musculoskeletal: Normal range of motion.  Neurological: She is alert and oriented to person, place, and time.  Skin: Skin is warm and dry.  Psychiatric: She has a normal mood and affect.    MAU Course  Procedures Results for orders placed or performed during the hospital encounter of 10/23/14 (from the past 24 hour(s))  Urinalysis, Routine w reflex microscopic     Status: Abnormal  Collection Time: 10/23/14  8:55 AM  Result Value Ref Range   Color, Urine RED (A) YELLOW   APPearance TURBID (A) CLEAR   Specific Gravity, Urine >1.030 (H) 1.005 - 1.030   pH 6.0 5.0 - 8.0   Glucose, UA NEGATIVE NEGATIVE mg/dL   Hgb urine dipstick LARGE (A) NEGATIVE   Bilirubin Urine NEGATIVE NEGATIVE   Ketones, ur NEGATIVE NEGATIVE mg/dL   Protein, ur 100 (A) NEGATIVE mg/dL   Urobilinogen, UA 0.2 0.0 - 1.0 mg/dL   Nitrite NEGATIVE NEGATIVE   Leukocytes, UA NEGATIVE NEGATIVE  Urine microscopic-add on     Status: Abnormal   Collection Time: 10/23/14  8:55 AM  Result Value Ref Range    WBC, UA 0-2 <3 WBC/hpf   RBC / HPF TOO NUMEROUS TO COUNT <3 RBC/hpf   Bacteria, UA FEW (A) RARE   Urine-Other DIPSTICK PERFORMED ON SUPERNATENT   Pregnancy, urine POC     Status: None   Collection Time: 10/23/14  9:03 AM  Result Value Ref Range   Preg Test, Ur NEGATIVE NEGATIVE  CBC     Status: Abnormal   Collection Time: 10/23/14  9:39 AM  Result Value Ref Range   WBC 7.5 4.0 - 10.5 K/uL   RBC 4.24 3.87 - 5.11 MIL/uL   Hemoglobin 9.0 (L) 12.0 - 15.0 g/dL   HCT 30.5 (L) 36.0 - 46.0 %   MCV 71.9 (L) 78.0 - 100.0 fL   MCH 21.2 (L) 26.0 - 34.0 pg   MCHC 29.5 (L) 30.0 - 36.0 g/dL   RDW 21.5 (H) 11.5 - 15.5 %   Platelets 362 150 - 400 K/uL  Wet prep, genital     Status: Abnormal   Collection Time: 10/23/14 10:16 AM  Result Value Ref Range   Yeast Wet Prep HPF POC NONE SEEN NONE SEEN   Trich, Wet Prep RARE (A) NONE SEEN   Clue Cells Wet Prep HPF POC NONE SEEN NONE SEEN   WBC, Wet Prep HPF POC RARE (A) NONE SEEN    MDM CLINICAL DATA: Left lower quadrant pelvic pain.  EXAM: TRANSABDOMINAL AND TRANSVAGINAL ULTRASOUND OF PELVIS  TECHNIQUE: Both transabdominal and transvaginal ultrasound examinations of the pelvis were performed. Transabdominal technique was performed for global imaging of the pelvis including uterus, ovaries, adnexal regions, and pelvic cul-de-sac. It was necessary to proceed with endovaginal exam following the transabdominal exam to visualize the adnexa.  COMPARISON: Pelvic ultrasound 11/26/2010.  FINDINGS: Uterus  Measurements: 12.3 x 8.2 x 11.2 cm. As on the prior examination, fibroids are seen in the uterus. The largest is submucosal and located in the upper uterine segment measuring 4.9 x 2.0 x 4.7 cm.  Endometrium  Thickness: 11 mm. No focal abnormality visualized.  Right ovary  Measurements: 3.0 x 1.3 x 1.5 cm. Normal appearance/no adnexal mass.  Left ovary  Measurements: 2.7 x 1.8 x 1.9 cm. Normal appearance/no adnexal  mass.  Other findings  No free fluid.  IMPRESSION: No acute finding. Fibroid uterus.  Assessment and Plan  Abnormal vaginal bleeding Trichomonas Fibroid uterus  Plan Will being on Megace to control the vaginal bleeding Has an appointment in the clinic on 10-26-14 for further evaluation. No blood transfusion needed today. Will treat for trichomonas today. Advised to have partner seek treatment for trichomonas. No sex for 10 days.  No sex until 10 days after partner is treated.  BURLESON,TERRI 10/23/2014, 9:58 AM

## 2014-10-23 NOTE — MAU Note (Signed)
Pt presents to MAU with complaints of chest pain, SOB, vaginal bleeding and pain in her left side. Reports chest pain since yesterday morning and she started her menstrual cycle yesterday morning.

## 2014-10-24 LAB — GC/CHLAMYDIA PROBE AMP (~~LOC~~) NOT AT ARMC
CHLAMYDIA, DNA PROBE: NEGATIVE
NEISSERIA GONORRHEA: NEGATIVE

## 2014-10-25 LAB — HIV ANTIBODY (ROUTINE TESTING W REFLEX): HIV Screen 4th Generation wRfx: NONREACTIVE

## 2014-10-25 LAB — RPR: RPR: NONREACTIVE

## 2014-10-26 ENCOUNTER — Ambulatory Visit: Payer: Self-pay | Admitting: Family Medicine

## 2015-09-29 IMAGING — US US TRANSVAGINAL NON-OB
1 series · 14 of 25 positions shown · non-contrast
Comparison: Pelvic ultrasound 11/26/2010.

CLINICAL DATA: Left lower quadrant pelvic pain.



[Series 1: us pelvis complete · 14 of 67 slices shown]
[im 1/67]
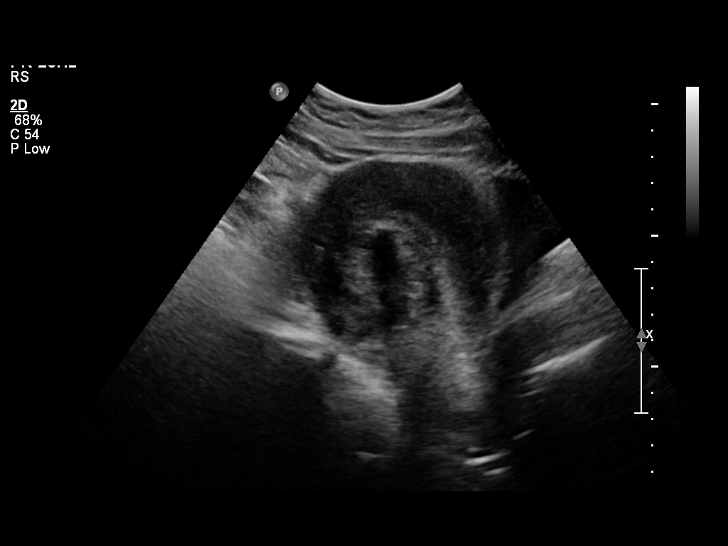
[im 6/67]
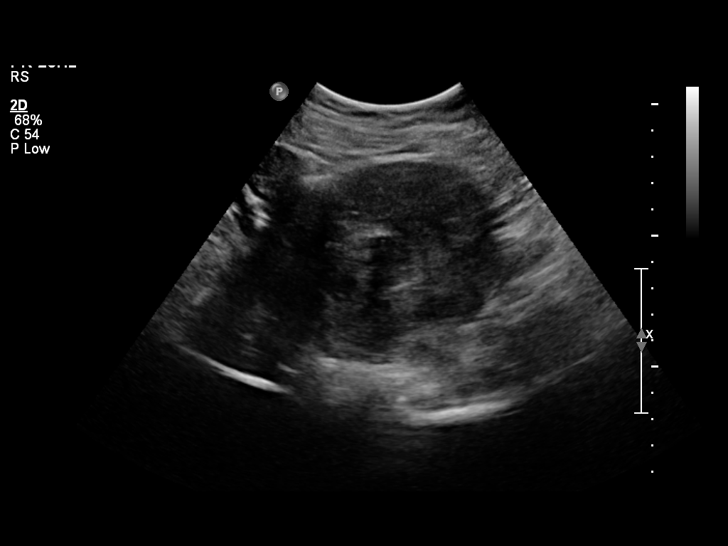
[im 12/67]
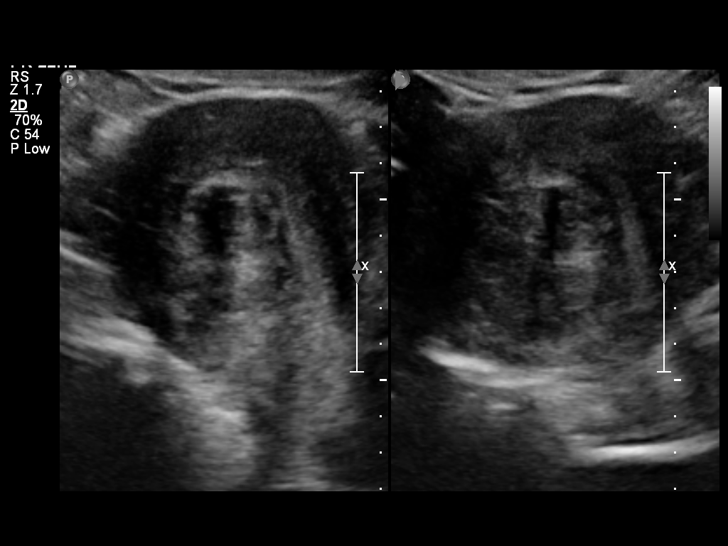
[im 17/67]
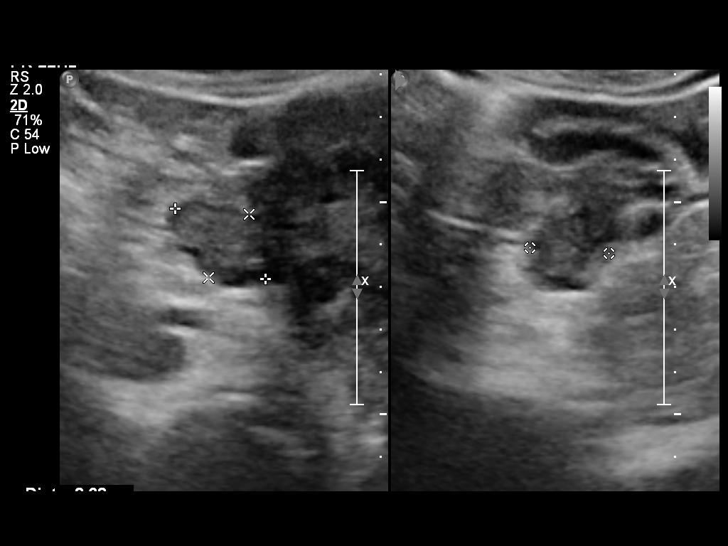
[im 23/67]
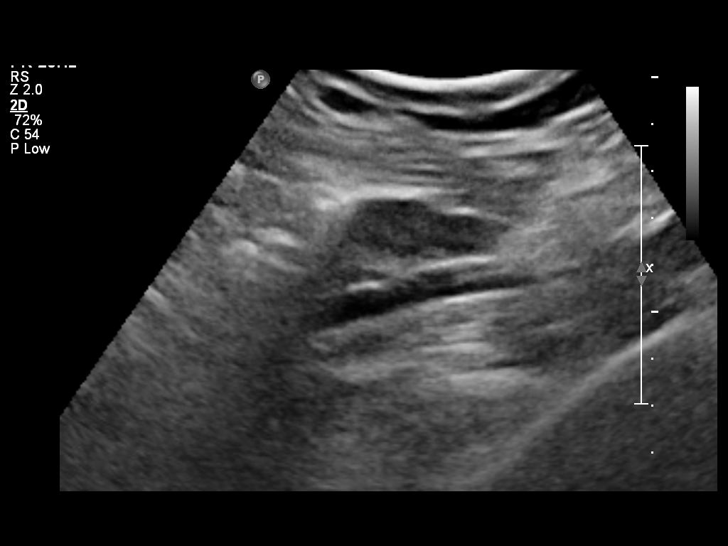
[im 25/67]
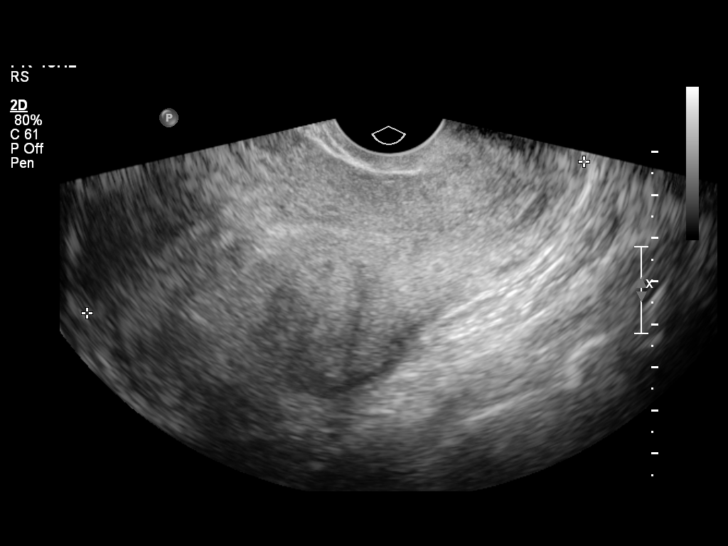
[im 31/67]
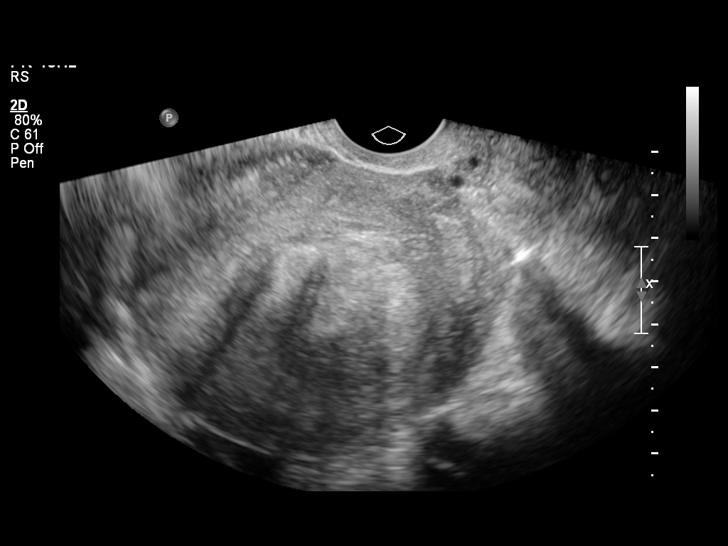
[im 36/67]
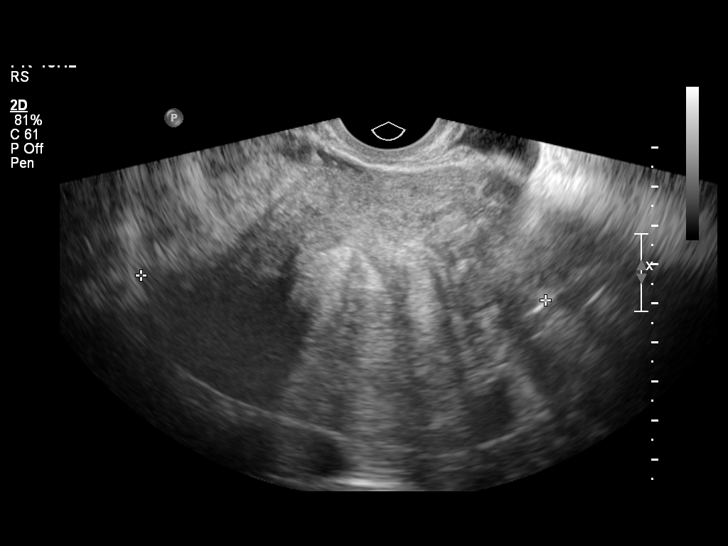
[im 42/67]
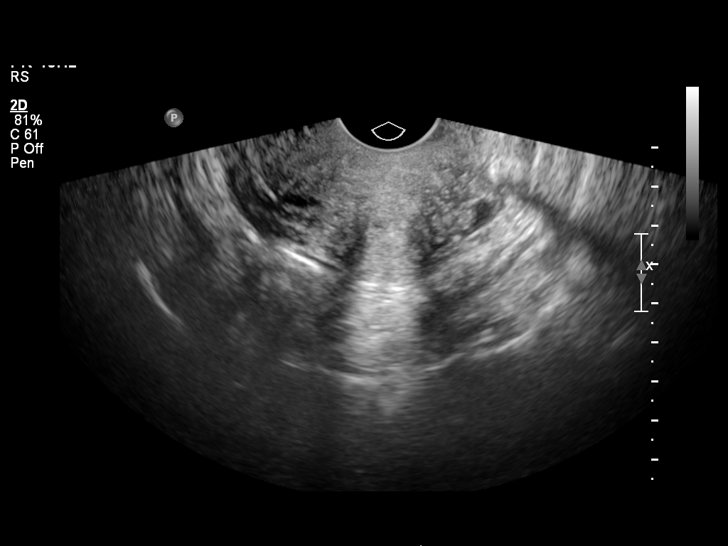
[im 45/67]
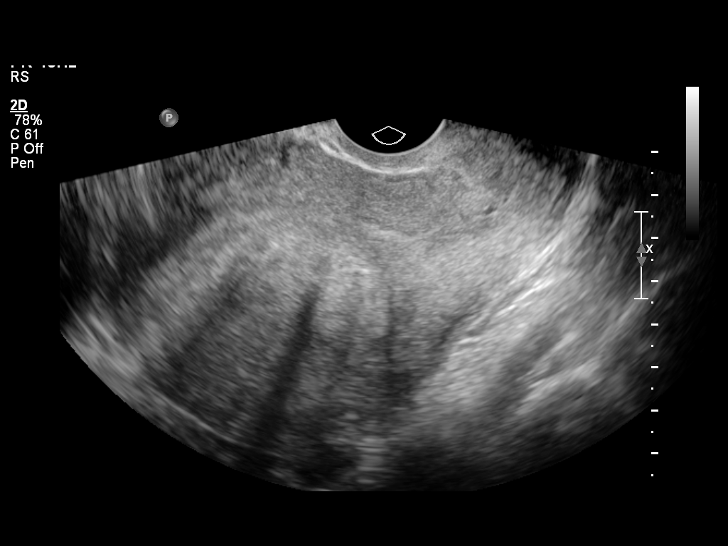
[im 50/67]
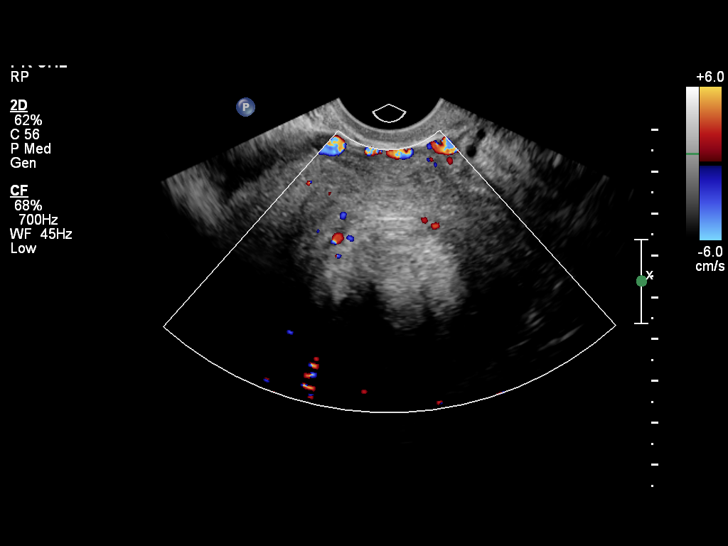
[im 56/67]
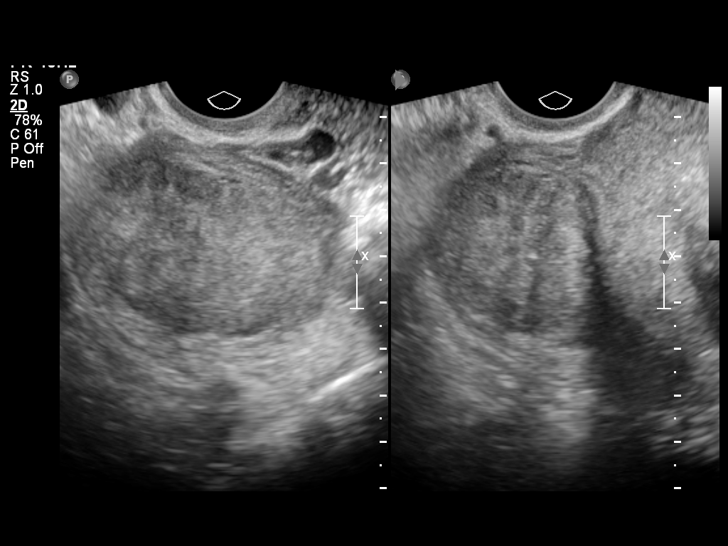
[im 61/67]
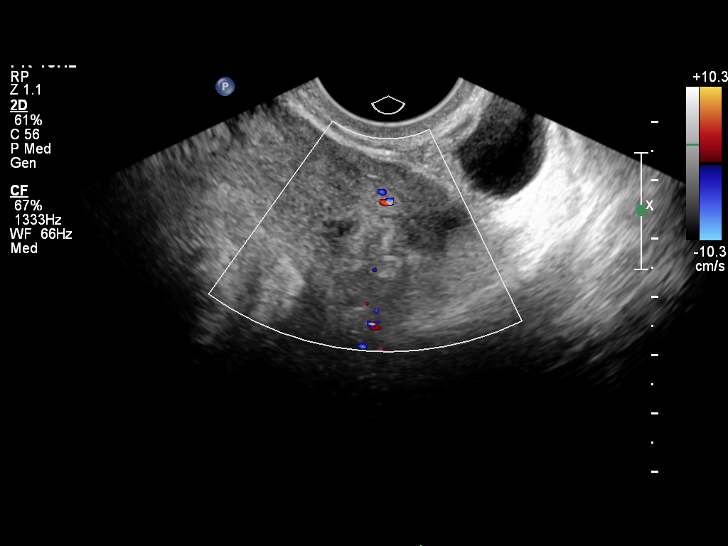
[im 67/67]
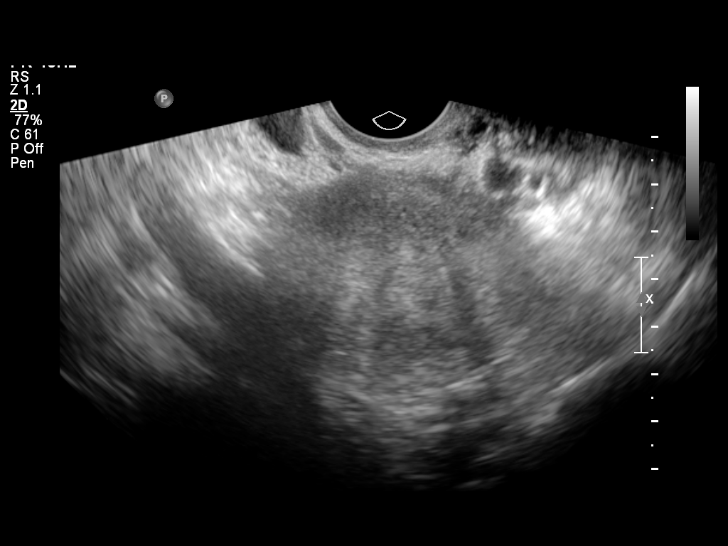

[14 of 25 positions shown; findings below may reference images not displayed]

FINDINGS: Uterus

Measurements: 12.3 x 8.2 x 11.2 cm.. As on the prior examination,
fibroids are seen in the uterus. The largest is submucosal and
located in the upper uterine segment measuring 4.9 x 2.0 x 4.7 cm.

Endometrium

Thickness: 11 mm.  No focal abnormality visualized.

Right ovary

Measurements: 3.0 x 1.3 x 1.5 cm. Normal appearance/no adnexal mass.

Left ovary

Measurements: 2.7 x 1.8 x 1.9 cm. Normal appearance/no adnexal mass.

Other findings

No free fluid.
IMPRESSION: No acute finding.  Fibroid uterus.
